# Patient Record
Sex: Male | Born: 1995 | Race: White | Hispanic: No | Marital: Single | Smoking: Current some day smoker
Health system: Southern US, Community
[De-identification: ages and names within clinical notes are randomized; demographics above are authoritative.]

## PROBLEM LIST (undated history)

## (undated) DIAGNOSIS — F909 Attention-deficit hyperactivity disorder, unspecified type: Secondary | ICD-10-CM

---

## 2014-08-16 ENCOUNTER — Encounter (HOSPITAL_COMMUNITY): Payer: Self-pay | Admitting: Emergency Medicine

## 2014-08-16 ENCOUNTER — Emergency Department (HOSPITAL_COMMUNITY)
Admission: EM | Admit: 2014-08-16 | Discharge: 2014-08-16 | Disposition: A | Discharge status: Home / Self Care | Payer: Self-pay | Attending: Emergency Medicine | Admitting: Emergency Medicine

## 2014-08-16 ENCOUNTER — Emergency Department (HOSPITAL_COMMUNITY): Payer: Self-pay

## 2014-08-16 DIAGNOSIS — Y92410 Unspecified street and highway as the place of occurrence of the external cause: Secondary | ICD-10-CM | POA: Insufficient documentation

## 2014-08-16 DIAGNOSIS — S8012XA Contusion of left lower leg, initial encounter: Secondary | ICD-10-CM | POA: Insufficient documentation

## 2014-08-16 DIAGNOSIS — Y9389 Activity, other specified: Secondary | ICD-10-CM | POA: Insufficient documentation

## 2014-08-16 DIAGNOSIS — S0990XA Unspecified injury of head, initial encounter: Secondary | ICD-10-CM | POA: Insufficient documentation

## 2014-08-16 DIAGNOSIS — Y998 Other external cause status: Secondary | ICD-10-CM | POA: Insufficient documentation

## 2014-08-16 MED ORDER — HYDROCODONE-ACETAMINOPHEN 5-325 MG PO TABS: 2.0000 | ORAL_TABLET | ORAL | Status: DC | PRN
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
## 2016-04-02 ENCOUNTER — Emergency Department (HOSPITAL_COMMUNITY)
Admission: EM | Admit: 2016-04-02 | Discharge: 2016-04-02 | Disposition: A | Discharge status: Home / Self Care | Payer: Managed Care, Other (non HMO) | Attending: Emergency Medicine | Admitting: Emergency Medicine

## 2016-04-02 ENCOUNTER — Emergency Department (HOSPITAL_COMMUNITY): Payer: Managed Care, Other (non HMO)

## 2016-04-02 ENCOUNTER — Encounter (HOSPITAL_COMMUNITY): Payer: Self-pay | Admitting: Emergency Medicine

## 2016-04-02 DIAGNOSIS — Y999 Unspecified external cause status: Secondary | ICD-10-CM | POA: Diagnosis not present

## 2016-04-02 DIAGNOSIS — S0093XA Contusion of unspecified part of head, initial encounter: Secondary | ICD-10-CM | POA: Insufficient documentation

## 2016-04-02 DIAGNOSIS — Y9289 Other specified places as the place of occurrence of the external cause: Secondary | ICD-10-CM | POA: Insufficient documentation

## 2016-04-02 DIAGNOSIS — Z23 Encounter for immunization: Secondary | ICD-10-CM | POA: Diagnosis not present

## 2016-04-02 DIAGNOSIS — Y9389 Activity, other specified: Secondary | ICD-10-CM | POA: Insufficient documentation

## 2016-04-02 DIAGNOSIS — S0990XA Unspecified injury of head, initial encounter: Secondary | ICD-10-CM | POA: Diagnosis present

## 2016-04-02 DIAGNOSIS — T148XXA Other injury of unspecified body region, initial encounter: Secondary | ICD-10-CM

## 2016-04-02 MED ORDER — TETANUS-DIPHTH-ACELL PERTUSSIS 5-2.5-18.5 LF-MCG/0.5 IM SUSP
0.5000 mL | Freq: Once | INTRAMUSCULAR | Status: AC
  Administered 2016-04-02: 0.5 mL via INTRAMUSCULAR
  Filled 2016-04-02: qty 0.5

## 2016-04-02 MED ORDER — METHOCARBAMOL 500 MG PO TABS: 500.0000 mg | ORAL_TABLET | Freq: Two times a day (BID) | ORAL | 0 refills | Status: DC

## 2016-04-02 MED ORDER — NAPROXEN 500 MG PO TABS
500.0000 mg | ORAL_TABLET | Freq: Once | ORAL | Status: AC
  Administered 2016-04-02: 500 mg via ORAL
  Filled 2016-04-02: qty 1

## 2016-04-02 MED ORDER — DICLOFENAC SODIUM ER 100 MG PO TB24: 100.0000 mg | ORAL_TABLET | Freq: Every day | ORAL | 0 refills | Status: DC

## 2016-04-02 MED ORDER — ACETAMINOPHEN 500 MG PO TABS
1000.0000 mg | ORAL_TABLET | Freq: Once | ORAL | Status: AC
  Administered 2016-04-02: 1000 mg via ORAL
  Filled 2016-04-02: qty 2
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
## 2016-04-03 ENCOUNTER — Encounter (HOSPITAL_COMMUNITY): Payer: Self-pay | Admitting: Emergency Medicine

## 2018-02-15 ENCOUNTER — Other Ambulatory Visit: Payer: Self-pay

## 2018-02-15 ENCOUNTER — Emergency Department (HOSPITAL_BASED_OUTPATIENT_CLINIC_OR_DEPARTMENT_OTHER)
Admission: EM | Admit: 2018-02-15 | Discharge: 2018-02-15 | Disposition: A | Discharge status: Home / Self Care | Payer: Managed Care, Other (non HMO) | Attending: Emergency Medicine | Admitting: Emergency Medicine

## 2018-02-15 ENCOUNTER — Encounter (HOSPITAL_BASED_OUTPATIENT_CLINIC_OR_DEPARTMENT_OTHER): Payer: Self-pay

## 2018-02-15 DIAGNOSIS — Z87891 Personal history of nicotine dependence: Secondary | ICD-10-CM | POA: Diagnosis not present

## 2018-02-15 DIAGNOSIS — M5441 Lumbago with sciatica, right side: Secondary | ICD-10-CM | POA: Insufficient documentation

## 2018-02-15 DIAGNOSIS — M545 Low back pain: Secondary | ICD-10-CM | POA: Diagnosis present

## 2018-02-15 HISTORY — DX: Attention-deficit hyperactivity disorder, unspecified type: F90.9

## 2018-02-15 MED ORDER — METHOCARBAMOL 500 MG PO TABS: 1000.0000 mg | ORAL_TABLET | Freq: Four times a day (QID) | ORAL | 0 refills | Status: AC

## 2018-02-15 MED ORDER — NAPROXEN 500 MG PO TABS: 500.0000 mg | ORAL_TABLET | Freq: Two times a day (BID) | ORAL | 0 refills | Status: AC

## 2018-02-15 NOTE — Discharge Instructions (Signed)
Please read and follow all provided instructions.  Your diagnoses today include:  1. Acute right-sided low back pain with right-sided sciatica    Tests performed today include:  Vital signs - see below for your results today  Medications prescribed:   Robaxin (methocarbamol) - muscle relaxer medication  DO NOT drive or perform any activities that require you to be awake and alert because this medicine can make you drowsy.    Naproxen - anti-inflammatory pain medication  Do not exceed 500mg  naproxen every 12 hours, take with food  You have been prescribed an anti-inflammatory medication or NSAID. Take with food. Take smallest effective dose for the shortest duration needed for your pain. Stop taking if you experience stomach pain or vomiting.   Take any prescribed medications only as directed.  Home care instructions:   Follow any educational materials contained in this packet  Please rest, use ice or heat on your back for the next several days  Do not lift, push, pull anything more than 10 pounds for the next week  Follow-up instructions: Please follow-up with your primary care provider in the next 1 week for further evaluation of your symptoms.   Return instructions:  SEEK IMMEDIATE MEDICAL ATTENTION IF YOU HAVE:  New numbness, tingling, weakness, or problem with the use of your arms or legs  Severe back pain not relieved with medications  Loss control of your bowels or bladder  Increasing pain in any areas of the body (such as chest or abdominal pain)  Shortness of breath, dizziness, or fainting.   Worsening nausea (feeling sick to your stomach), vomiting, fever, or sweats  Any other emergent concerns regarding your health   Additional Information:  Your vital signs today were: BP 126/78 (BP Location: Right Arm)    Pulse 72    Temp 98.3 F (36.8 C) (Oral)    Resp 17    Ht 5\' 8"  (1.727 m)    Wt 79.4 kg (175 lb)    SpO2 100%    BMI 26.61 kg/m  If your blood  pressure (BP) was elevated above 135/85 this visit, please have this repeated by your doctor within one month. --------------

## 2018-02-15 NOTE — ED Triage Notes (Addendum)
Pt reports worsening 5/10 lower back pain w/o relief from OTC meds and cryotherapy. Pt denies recent injury or fall. Pt A+OX4, NAD, ambulatory independently.

## 2018-02-15 NOTE — ED Provider Notes (Signed)
[facility] EMERGENCY DEPARTMENT Provider Note   CSN: [id number] Arrival date & time: 02/15/18  1714     History   Chief Complaint Chief Complaint  Patient presents with  . Back Pain    HPI Jesus Kemp is a 22 y.o. male.  Patient presents with complaint of back pain starting several days ago without acute injury.  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  Patient has had pain in his right lower back with radiation down into his right leg at times.  He states 2 days ago the pain was so severe he was having difficulty walking.  It is improving but not resolved.  No numbness, tingling, or weakness in his legs.  He has been taking over-the-counter medications and using ice without improvement. Patient denies warning symptoms of back pain including: fecal incontinence, urinary retention or overflow incontinence, night sweats, waking from sleep with back pain, unexplained fevers or weight loss, h/o cancer, IVDU, recent trauma.        Past Medical History:  Diagnosis Date  . ADHD     There are no active problems to display for this patient.   History reviewed. No pertinent surgical history.      Home Medications    Prior to Admission medications   Not on File    Family History History reviewed. No pertinent family history.  Social History Social History   Tobacco Use  . Smoking status: Former Games developermoker  . Smokeless tobacco: Never Used  Substance Use Topics  . Alcohol use: Not on file  . Drug use: Not on file     Allergies   Patient has no known allergies.   Review of Systems Review of Systems  Constitutional: Negative for fever and unexpected weight change.  Gastrointestinal: Negative for constipation.       Neg for fecal incontinence  Genitourinary: Negative for difficulty urinating, flank pain and hematuria.       Negative for urinary incontinence or retention  Musculoskeletal: Positive  for back pain.  Neurological: Negative for weakness and numbness.       Negative for saddle paresthesias      Physical Exam Updated Vital Signs BP 126/78 (BP Location: Right Arm)   Pulse 72   Temp 98.3 F (36.8 C) (Oral)   Resp 17   Ht 5\' 8"  (1.727 m)   Wt 79.4 kg (175 lb)   SpO2 100%   BMI 26.61 kg/m   Physical Exam  Constitutional: He appears well-developed and well-nourished.  HENT:  Head: Normocephalic and atraumatic.  Eyes: Conjunctivae are normal.  Neck: Normal range of motion.  Abdominal: Soft. There is no tenderness. There is no CVA tenderness.  Musculoskeletal:       Cervical back: He exhibits normal range of motion, no tenderness and no bony tenderness.       Thoracic back: He exhibits normal range of motion, no tenderness and no bony tenderness.       Lumbar back: He exhibits decreased range of motion, tenderness and spasm. He exhibits no bony tenderness.       Back:  No step-off noted with palpation of spine.   Neurological: He is alert. He has normal reflexes. No sensory deficit. He exhibits normal muscle tone.  5/5 strength in entire lower extremities bilaterally. No sensation deficit.   Skin: Skin is warm and dry.  Psychiatric: He has a normal mood and affect.  Nursing note and  vitals reviewed.    ED Treatments / Results  Labs (all labs ordered are listed, but only abnormal results are displayed) Labs Reviewed - No data to display  EKG None  Radiology No results found.  Procedures Procedures (including critical care time)  Medications Ordered in ED Medications - No data to display   Initial Impression / Assessment and Plan / ED Course  I have reviewed the triage vital signs and the nursing notes.  Pertinent labs & imaging results that were available during my care of the patient were reviewed by me and considered in my medical decision making (see chart for details).     6:08 PM Patient seen and examined.   Vital signs reviewed and  are as follows: Vitals:   02/15/18 1724  BP: 126/78  Pulse: 72  Resp: 17  Temp: 98.3 F (36.8 C)  SpO2: 100%    No red flag s/s of low back pain. Patient was counseled on back pain precautions and told to do activity as tolerated but do not lift, push, or pull heavy objects more than 10 pounds for the next week.  Patient counseled to use ice or heat on back for no longer than 15 minutes every hour.   Patient counseled on proper use of muscle relaxant medication.  They were told not to drink alcohol, drive any vehicle, or do any dangerous activities while taking this medication.  Patient verbalized understanding.  Patient urged to follow-up with PCP if pain does not improve with treatment and rest or if pain becomes recurrent. Urged to return with worsening severe pain, loss of bowel or bladder control, trouble walking.   The patient verbalizes understanding and agrees with the plan.    Final Clinical Impressions(s) / ED Diagnoses   Final diagnoses:  Acute right-sided low back pain with right-sided sciatica   Patient with back pain, with some radicular features on the right side. No neurological deficits. Patient is ambulatory. No warning symptoms of back pain including: fecal incontinence, urinary retention or overflow incontinence, night sweats, waking from sleep with back pain, unexplained fevers or weight loss, h/o cancer, IVDU, recent trauma. No concern for cauda equina, epidural abscess, or other serious cause of back pain. Conservative measures such as rest, ice/heat and pain medicine indicated with PCP follow-up if no improvement with conservative management.    ED Discharge Orders        Ordered    naproxen (NAPROSYN) 500 MG tablet  2 times daily     02/15/18 1803    methocarbamol (ROBAXIN) 500 MG tablet  4 times daily     02/15/18 1803       Renne Crigler, PA-C 02/15/18 1808    Tegeler, Canary Brim, MD 02/16/18 [phone]

## 2018-05-10 ENCOUNTER — Other Ambulatory Visit: Payer: Self-pay | Admitting: Psychiatry

## 2018-05-29 ENCOUNTER — Ambulatory Visit: Payer: Self-pay | Admitting: Psychiatry

## 2018-06-25 ENCOUNTER — Other Ambulatory Visit: Payer: Self-pay | Admitting: Psychiatry

## 2018-06-25 NOTE — Telephone Encounter (Signed)
Need to review paper chart  

## 2018-07-15 ENCOUNTER — Other Ambulatory Visit: Payer: Self-pay | Admitting: Psychiatry

## 2018-07-16 NOTE — Telephone Encounter (Signed)
Need to review paper chart  

## 2018-07-25 ENCOUNTER — Other Ambulatory Visit: Payer: Self-pay

## 2018-07-25 MED ORDER — SERTRALINE HCL 50 MG PO TABS: 50.0000 mg | ORAL_TABLET | Freq: Every day | ORAL | 0 refills | Status: DC

## 2018-07-25 MED ORDER — LITHIUM CARBONATE ER 300 MG PO TBCR: 600.0000 mg | EXTENDED_RELEASE_TABLET | Freq: Every day | ORAL | 0 refills | Status: DC

## 2018-08-06 ENCOUNTER — Ambulatory Visit: Payer: 59 | Admitting: Psychiatry

## 2018-09-04 ENCOUNTER — Other Ambulatory Visit: Payer: Self-pay

## 2018-09-04 ENCOUNTER — Telehealth: Payer: Self-pay | Admitting: Psychiatry

## 2018-09-04 MED ORDER — SERTRALINE HCL 50 MG PO TABS: 50.0000 mg | ORAL_TABLET | Freq: Every day | ORAL | 0 refills | Status: DC

## 2018-09-04 NOTE — Telephone Encounter (Signed)
Pt needs refill on sertraline. He has made an appt for Monday.

## 2018-09-04 NOTE — Telephone Encounter (Signed)
Refill submitted. 

## 2018-09-08 ENCOUNTER — Ambulatory Visit: Payer: 59 | Admitting: Psychiatry

## 2018-09-08 DIAGNOSIS — F39 Unspecified mood [affective] disorder: Secondary | ICD-10-CM | POA: Diagnosis not present

## 2018-09-08 DIAGNOSIS — F429 Obsessive-compulsive disorder, unspecified: Secondary | ICD-10-CM

## 2018-09-08 MED ORDER — LITHIUM CARBONATE ER 300 MG PO TBCR: EXTENDED_RELEASE_TABLET | ORAL | 1 refills | Status: DC

## 2018-09-08 MED ORDER — SERTRALINE HCL 50 MG PO TABS: 50.0000 mg | ORAL_TABLET | Freq: Every day | ORAL | 1 refills | Status: DC

## 2018-09-08 NOTE — Progress Notes (Signed)
[facility]  Patient ID: Jesus Kemp,  MRN: [id number]  PCP: Patient, No Pcp Per  Date of Evaluation: 09/08/2018 Time spent:25 minutes  Chief Complaint:   HISTORY/CURRENT STATUS: HPI patient seen in June of last year.  At the time he did have some mood swings.  We started him on lithium to help with mood swings.  New He has done well.  Individual Medical History/ Review of Systems: Changes? :no  Allergies: Patient has no known allergies.  Current Medications:  Current Outpatient Medications:  .  lithium carbonate (LITHOBID) 300 MG CR tablet, 1 tab/day, Disp: 90 tablet, Rfl: 1 .  sertraline (ZOLOFT) 50 MG tablet, Take 1 tablet (50 mg total) by mouth daily., Disp: 90 tablet, Rfl: 1 .  methocarbamol (ROBAXIN) 500 MG tablet, Take 2 tablets (1,000 mg total) by mouth 4 (four) times daily., Disp: 20 tablet, Rfl: 0 .  naproxen (NAPROSYN) 500 MG tablet, Take 1 tablet (500 mg total) by mouth 2 (two) times daily., Disp: 20 tablet, Rfl: 0 Medication Side Effects: no  Family Medical/ Social History: Changes? no  MENTAL HEALTH EXAM:  There were no vitals taken for this visit.There is no height or weight on file to calculate BMI.  General Appearance: Casual  Eye Contact:  Good  Speech:  Normal Rate  Volume:  Normal  Mood:  Euthymic  Affect:  Appropriate  Thought Process:  Linear  Orientation:  Full (Time, Place, and Person)  Thought Content: Logical   Suicidal Thoughts:  No  Homicidal Thoughts:  No  Memory:  WNL  Judgement:  Good  Insight:  Good  Psychomotor Activity:  Normal  Concentration:  Concentration: Good  Recall:  Good  Fund of Knowledge: Good  Language: Good  Assets:  Desire for Improvement  ADL's:  Intact  Cognition: WNL  Prognosis:  Good    DIAGNOSES:    ICD-10-CM   1. Episodic mood disorder (HCC) F39 TSH    Lithium level    Basic metabolic panel  2. Obsessive-compulsive disorder, unspecified type F42.9     Receiving Psychotherapy: No     RECOMMENDATIONS: doing well. Continue lithium, nd zoloft./Start NAC if needed. Labs recheck 3 months   Anne Fu, [place]

## 2018-10-02 ENCOUNTER — Ambulatory Visit (INDEPENDENT_AMBULATORY_CARE_PROVIDER_SITE_OTHER): Payer: 59 | Admitting: Psychiatry

## 2018-10-02 DIAGNOSIS — F39 Unspecified mood [affective] disorder: Secondary | ICD-10-CM | POA: Insufficient documentation

## 2018-10-02 MED ORDER — SERTRALINE HCL 50 MG PO TABS: 50.0000 mg | ORAL_TABLET | Freq: Every day | ORAL | 1 refills | Status: AC

## 2018-10-02 MED ORDER — DIVALPROEX SODIUM ER 500 MG PO TB24: ORAL_TABLET | ORAL | 1 refills | Status: DC

## 2018-10-02 NOTE — Progress Notes (Signed)
[facility]  Patient ID: Jesus Kemp,  MRN: [id number]  PCP: Patient, No Pcp Per  Date of Evaluation: 10/02/2018 Time spent:20 minutes  Chief Complaint:   HISTORY/CURRENT STATUS: HPI patient last seen 3 weeks ago he was doing fairly well.  Since that time he started smoking pot every night and he drinks alcohol 7-10 beers plus shots 2 days a week.  His girlfriend is threatening to leave him if he did it if he has not changed he had a lot of outbursts both with and without ocular alcohol anger control no no anger control yells at people's faces worse when he is been drinking several days ago he could not contain his anger but is not breaking things.  Also complains of rapid mood swings for a year manic symptoms last 1 to 3hours then he might go immediately go into depression. He does have several blackouts when he is drinking.  He started lithium about July 2019 and he does feel like it helps. Patient was interested in any kind of treatment that will help whether it be rehab [facility].  Individual Medical History/ Review of Systems: Changes? :No   Allergies: Patient has no known allergies.  Current Medications:  Current Outpatient Medications:  .  lithium carbonate (LITHOBID) 300 MG CR tablet, 1 tab/day, Disp: 90 tablet, Rfl: 1 .  methocarbamol (ROBAXIN) 500 MG tablet, Take 2 tablets (1,000 mg total) by mouth 4 (four) times daily., Disp: 20 tablet, Rfl: 0 .  naproxen (NAPROSYN) 500 MG tablet, Take 1 tablet (500 mg total) by mouth 2 (two) times daily., Disp: 20 tablet, Rfl: 0 .  sertraline (ZOLOFT) 50 MG tablet, Take 1 tablet (50 mg total) by mouth daily., Disp: 90 tablet, Rfl: 1 Medication Side Effects: none  Family Medical/ Social History: Changes? No  MENTAL HEALTH EXAM:  There were no vitals taken for this visit.There is no height or weight on file to calculate BMI.  General Appearance: Casual  Eye Contact:  Good  Speech:  Clear and Coherent   Volume:  Normal  Mood:  Depressed  Affect:  Appropriate  Thought Process:  Linear  Orientation:  Full (Time, Place, and Person)  Thought Content: Logical   Suicidal Thoughts:  No  Homicidal Thoughts:  No  Memory:  WNL  Judgement:  Fair  Insight:  Fair  Psychomotor Activity:  Normal  Concentration:  Concentration: Good  Recall:  Good  Fund of Knowledge: Good  Language: Good  Assets:  Desire for Improvement  ADL's:  Intact  Cognition: WNL  Prognosis:  Fair    DIAGNOSES: No diagnosis found.  Receiving Psychotherapy: No    RECOMMENDATIONS: Patient has increased his alcohol and his pot lately.  He is interested in a rehab center.  Discussed this with Dr. Haywood Lasso and not the patient can surely do that but we recommend [facility]  partial outpatient program and that number is [phone]. We will get lithium level and LFTs for his drinking.  To note patient heavy drug use 2 years ago.   Anne Fu, PA-C

## 2018-10-03 ENCOUNTER — Other Ambulatory Visit: Payer: Self-pay

## 2018-10-03 MED ORDER — DIVALPROEX SODIUM ER 500 MG PO TB24: ORAL_TABLET | ORAL | 1 refills | Status: DC

## 2018-10-03 NOTE — Progress Notes (Signed)
depakote rx not received, resent to [facility].

## 2018-10-07 ENCOUNTER — Telehealth (HOSPITAL_COMMUNITY): Payer: Self-pay | Admitting: Professional

## 2018-10-08 ENCOUNTER — Telehealth (HOSPITAL_COMMUNITY): Payer: Self-pay | Admitting: Professional

## 2018-10-08 NOTE — Telephone Encounter (Signed)
Cln returned pt call for 2x. Pt reports [employer] ref. Him to [facility] "to get help with my BiPolar." Pt states he wants to start group on Friday. Cln explained ax process to pt and that information would be gathered during the phone call to make sure an ax was necessary. Pt states he drinks and smokes marijuana recreationally and gets angry when he does; "I don't get physically violent, but I will throw my Xbox and put a hole in the wall. That kind of thing." Pt states he has a fiance and a baby now and wants to get better for them. Pt reports he is very irritable.  Cln orients pt to group. Pt agrees to group. Cln asked pt if he would be able to take off work and pt reports "I have 2 or sometimes 1 day off each week so I could come on those days." Cln re-states that group is Monday-Friday for an average of 2 weeks and pt would need to be here daily. Pt states he wants to discuss with his fiance "because of my work schedule and stuff" and will call cln back later. Pt hung up.

## 2018-10-09 ENCOUNTER — Ambulatory Visit: Payer: 59 | Admitting: Psychiatry

## 2018-10-11 ENCOUNTER — Emergency Department (HOSPITAL_COMMUNITY): Payer: 59

## 2018-10-11 ENCOUNTER — Other Ambulatory Visit: Payer: Self-pay

## 2018-10-11 ENCOUNTER — Encounter (HOSPITAL_COMMUNITY): Payer: Self-pay | Admitting: Emergency Medicine

## 2018-10-11 ENCOUNTER — Emergency Department (HOSPITAL_COMMUNITY)
Admission: EM | Admit: 2018-10-11 | Discharge: 2018-10-11 | Disposition: A | Discharge status: Home / Self Care | Payer: 59 | Attending: Emergency Medicine | Admitting: Emergency Medicine

## 2018-10-11 DIAGNOSIS — F17200 Nicotine dependence, unspecified, uncomplicated: Secondary | ICD-10-CM | POA: Diagnosis not present

## 2018-10-11 DIAGNOSIS — Z79899 Other long term (current) drug therapy: Secondary | ICD-10-CM | POA: Diagnosis not present

## 2018-10-11 DIAGNOSIS — B349 Viral infection, unspecified: Secondary | ICD-10-CM | POA: Insufficient documentation

## 2018-10-11 DIAGNOSIS — R070 Pain in throat: Secondary | ICD-10-CM | POA: Diagnosis present

## 2018-10-11 LAB — RESPIRATORY PANEL BY PCR
Adenovirus: NOT DETECTED
Bordetella pertussis: NOT DETECTED
CHLAMYDOPHILA PNEUMONIAE-RVPPCR: NOT DETECTED
CORONAVIRUS 229E-RVPPCR: NOT DETECTED
Coronavirus HKU1: NOT DETECTED
Coronavirus NL63: NOT DETECTED
Coronavirus OC43: NOT DETECTED
INFLUENZA A-RVPPCR: NOT DETECTED
INFLUENZA B-RVPPCR: NOT DETECTED
MYCOPLASMA PNEUMONIAE-RVPPCR: NOT DETECTED
Metapneumovirus: NOT DETECTED
PARAINFLUENZA VIRUS 4-RVPPCR: NOT DETECTED
Parainfluenza Virus 1: NOT DETECTED
Parainfluenza Virus 2: NOT DETECTED
Parainfluenza Virus 3: NOT DETECTED
Respiratory Syncytial Virus: NOT DETECTED
Rhinovirus / Enterovirus: NOT DETECTED

## 2018-10-11 LAB — INFLUENZA PANEL BY PCR (TYPE A & B)
Influenza A By PCR: NEGATIVE
Influenza B By PCR: NEGATIVE

## 2018-10-11 NOTE — ED Notes (Signed)
Report from [facility].  Pt refusing to sign Guidance for Persons under Investigation form.  Paged ID.

## 2018-10-11 NOTE — ED Notes (Signed)
Faxed copy of CDC PUI and case report form to [facility].

## 2018-10-11 NOTE — ED Provider Notes (Signed)
[facility] EMERGENCY DEPARTMENT Provider Note   CSN: [id number] Arrival date & time: 10/11/18  1345    History   Chief Complaint Chief Complaint  Patient presents with  . Generalized Body Aches  . Sore Throat    HPI Jesus Kemp is a 23 y.o. male.     HPI Patient reports he pretty rapidly came down with body aches and sore throat was a little bit of cough today.  He reports he is having some chills but did not have a fever that he measured.  He reports he works out at [employer] a lot and he knows what that kind of body ache feels like but this feels different.  He reports he did cough up some sputum this morning that was pale yellow and slightly blood-tinged.  (He did show me an image that showed a small volume of pale mucus with some pink blood streaking).  Patient has no travel history.  He has been in the [place] area.  He does report however his fiance works at [employer] where her coworker's partner tested Covid19 positive.  She reports that coworker was immediately sent home once that information was known. That was 2 days ago. Past Medical History:  Diagnosis Date  . ADHD     Patient Active Problem List   Diagnosis Date Noted  . Episodic mood disorder (HCC) 10/02/2018    History reviewed. No pertinent surgical history.      Home Medications    Prior to Admission medications   Medication Sig Start Date End Date Taking? Authorizing Provider  divalproex (DEPAKOTE ER) 500 MG 24 hr tablet 1 a day for a week, then 2/day. Take in evening with food. Patient taking differently: Take 500-1,000 mg by mouth See admin instructions. Take 1 tablet (500mg ) every evening for one week, then increase to 2 tablets (1000mg ) every evening. Started on 10-04-18 10/03/18  Yes Shugart, Mat Carne, PA-C  lithium carbonate (LITHOBID) 300 MG CR tablet 1 tab/day Patient taking differently: Take 300 mg by mouth at bedtime.  09/08/18  Yes Shugart, Mat Carne, PA-C  sertraline  (ZOLOFT) 50 MG tablet Take 1 tablet (50 mg total) by mouth daily. Patient taking differently: Take 50 mg by mouth at bedtime.  10/02/18  Yes Shugart, Mat Carne, PA-C  methocarbamol (ROBAXIN) 500 MG tablet Take 2 tablets (1,000 mg total) by mouth 4 (four) times daily. Patient not taking: Reported on 10/11/2018 02/15/18   Renne Crigler, PA-C  naproxen (NAPROSYN) 500 MG tablet Take 1 tablet (500 mg total) by mouth 2 (two) times daily. Patient not taking: Reported on 10/11/2018 02/15/18   Renne Crigler, PA-C    Family History No family history on file.  Social History Social History   Tobacco Use  . Smoking status: Current Some Day Smoker  . Smokeless tobacco: Current User  Substance Use Topics  . Alcohol use: Not Currently  . Drug use: Not Currently     Allergies   Patient has no known allergies.   Review of Systems Review of Systems 10 Systems reviewed and are negative for acute change except as noted in the HPI.   Physical Exam Updated Vital Signs BP 115/74 (BP Location: Right Arm)   Pulse 80   Temp (!) 97.5 F (36.4 C)   Resp 17   SpO2 97%   Physical Exam Constitutional:      Appearance: Normal appearance. He is well-developed.     Comments: Patient is clinically well in appearance.  He is alert  and nontoxic.  Well-nourished well-developed.  No respiratory distress.  No active coughing while in the room.  HENT:     Head: Normocephalic and atraumatic.     Nose: Nose normal.     Mouth/Throat:     Mouth: Mucous membranes are moist.     Pharynx: Oropharynx is clear.  Eyes:     Conjunctiva/sclera: Conjunctivae normal.  Neck:     Musculoskeletal: Neck supple.  Cardiovascular:     Rate and Rhythm: Normal rate and regular rhythm.     Heart sounds: Normal heart sounds.  Pulmonary:     Effort: Pulmonary effort is normal.     Breath sounds: Normal breath sounds.  Abdominal:     General: Bowel sounds are normal. There is no distension.     Palpations: Abdomen is soft.      Tenderness: There is no abdominal tenderness.  Musculoskeletal: Normal range of motion.  Skin:    General: Skin is warm and dry.  Neurological:     General: No focal deficit present.     Mental Status: He is alert and oriented to person, place, and time.     GCS: GCS eye subscore is 4. GCS verbal subscore is 5. GCS motor subscore is 6.     Coordination: Coordination normal.  Psychiatric:        Mood and Affect: Mood normal.      ED Treatments / Results  Labs (all labs ordered are listed, but only abnormal results are displayed) Labs Reviewed  RESPIRATORY PANEL BY PCR  NOVEL CORONAVIRUS, NAA (HOSPITAL ORDER, SEND-OUT TO REF LAB)  INFLUENZA PANEL BY PCR (TYPE A & B)    EKG None  Radiology Dg Chest 2 View  Result Date: 10/11/2018 CLINICAL DATA:  Cough EXAM: CHEST - 2 VIEW COMPARISON:  None. FINDINGS: Heart and mediastinal contours are within normal limits. No focal opacities or effusions. No acute bony abnormality. IMPRESSION: No active cardiopulmonary disease. Electronically Signed   By: Charlett Nose M.D.   On: 10/11/2018 17:28    Procedures Procedures (including critical care time)  Medications Ordered in ED Medications - No data to display   Initial Impression / Assessment and Plan / ED Course  I have reviewed the triage vital signs and the nursing notes.  Pertinent labs & imaging results that were available during my care of the patient were reviewed by me and considered in my medical decision making (see chart for details).  Clinical Course as of Oct 11 1850  Sat Oct 11, 2018  1638 Consult: Dr. Orvan Falconer   [MP]    Clinical Course User Index [MP] Arby Barrette, MD       Patient presented with symptoms of acute onset of viral-like illness.  He started with chills and muscle aches.  Patient is clinically well in appearance.  He does not have active fever.  He has tested negative for respiratory panel and influenza.  At this time there is a possible link to  coronavirus.  His fiance works with an individual who has a significant other with confirmed positive.  Patient is being placed on a self quarantine and provided with the CDC requirements sheet.  Return precautions are reviewed.  Final Clinical Impressions(s) / ED Diagnoses   Final diagnoses:  Viral illness Surveillance forCovid19    ED Discharge Orders    None       Arby Barrette, MD 10/11/18 [phone]

## 2018-10-11 NOTE — Discharge Instructions (Addendum)
1.  You are being tested for possible coronavirus due to flulike symptoms and a possible exposure through your family member. 2.  You are to self quarantine as per the instruction sheet provided to you from the emergency department. 3.  Have a phone contact with your family physician to review your symptoms. 4.  If you are getting worsening symptoms and need to return to the emergency department, call ahead to make them aware of your arrival.

## 2018-10-11 NOTE — ED Triage Notes (Signed)
Pt. Stated, I have body aches and a sore throat started today. Sometimes a chill

## 2018-10-11 NOTE — ED Notes (Signed)
Cliffton Asters, ID returned this nurse call.  Advised that pt is refusing to sign Persons Under Investigation form.  Advised at this time to write on the form "refused to sign".   Called Angie @ [phone] to confirm plan.

## 2018-10-20 LAB — NOVEL CORONAVIRUS, NAA (HOSP ORDER, SEND-OUT TO REF LAB; TAT 18-24 HRS): SARS-CoV-2, NAA: NOT DETECTED

## 2018-10-29 ENCOUNTER — Ambulatory Visit (INDEPENDENT_AMBULATORY_CARE_PROVIDER_SITE_OTHER): Payer: 59 | Admitting: Psychiatry

## 2018-10-29 ENCOUNTER — Encounter: Payer: Self-pay | Admitting: Psychiatry

## 2018-10-29 DIAGNOSIS — F429 Obsessive-compulsive disorder, unspecified: Secondary | ICD-10-CM

## 2018-10-29 DIAGNOSIS — F39 Unspecified mood [affective] disorder: Secondary | ICD-10-CM | POA: Diagnosis not present

## 2018-10-29 NOTE — Progress Notes (Signed)
Jesus Kemp [id number] [birth year] 23 y.o.  Subjective:   Patient ID:  Jesus Kemp is a 23 y.o. (DOB 05/31/1970) male.  Chief Complaint:  Chief Complaint  Patient presents with  . Drug Problem  . Alcohol Problem  . Anxiety    HPI Jesus Kemp was called today today for follow-up of his mood disorder, binge drinking, anger problems, and OCD.  At his last visit March 5 he was having anger outbursts and drinking.  He was started on Depakote for anger and mood control.  He complains that he had a lot of side effects with it and could not tolerate it. Depakote SE dizzy, nausea, vomiting, aches, NM.  He has stopped the medication.  His anger problems are better.  He denies any recent episodes of mania or hypomania.  He is not depressed.  He is sleeping well.  Stopped drinking 2 mos.  Easy.  Was binging.  Still obsessions but manageable.  Anxiety overall is manageable  Naturally hyper bc ADHD.  Review of Systems:  Review of Systems  Neurological: Negative for tremors and weakness.  Psychiatric/Behavioral: Positive for sleep disturbance. Negative for agitation, behavioral problems, confusion, decreased concentration, dysphoric mood, hallucinations, self-injury and suicidal ideas. The patient is nervous/anxious. The patient is not hyperactive.     Medications: I have reviewed the patient's current medications.  Current Outpatient Medications  Medication Sig Dispense Refill  . sertraline (ZOLOFT) 50 MG tablet Take 1 tablet (50 mg total) by mouth daily. (Patient taking differently: Take 50 mg by mouth at bedtime. ) 90 tablet 1  . lithium carbonate (LITHOBID) 300 MG CR tablet 1 tab/day (Patient not taking: Reported on 10/29/2018) 90 tablet 1  . methocarbamol (ROBAXIN) 500 MG tablet Take 2 tablets (1,000 mg total) by mouth 4 (four) times daily. (Patient not taking: Reported on 10/11/2018) 20 tablet 0  . naproxen (NAPROSYN) 500 MG tablet Take 1 tablet (500 mg total) by mouth 2  (two) times daily. (Patient not taking: Reported on 10/11/2018) 20 tablet 0   No current facility-administered medications for this visit.     Medication Side Effects: None  Allergies: No Known Allergies  Past Medical History:  Diagnosis Date  . ADHD     History reviewed. No pertinent family history.  Social History   Socioeconomic History  . Marital status: Single    Spouse name: Not on file  . Number of children: Not on file  . Years of education: Not on file  . Highest education level: Not on file  Occupational History  . Not on file  Social Needs  . Financial resource strain: Not on file  . Food insecurity:    Worry: Not on file    Inability: Not on file  . Transportation needs:    Medical: Not on file    Non-medical: Not on file  Tobacco Use  . Smoking status: Current Some Day Smoker  . Smokeless tobacco: Current User  Substance and Sexual Activity  . Alcohol use: Not Currently  . Drug use: Not Currently  . Sexual activity: Not on file  Lifestyle  . Physical activity:    Days per week: Not on file    Minutes per session: Not on file  . Stress: Not on file  Relationships  . Social connections:    Talks on phone: Not on file    Gets together: Not on file    Attends religious service: Not on file    Active member of club or organization: Not on  file    Attends meetings of clubs or organizations: Not on file    Relationship status: Not on file  . Intimate partner violence:    Fear of current or ex partner: Not on file    Emotionally abused: Not on file    Physically abused: Not on file    Forced sexual activity: Not on file  Other Topics Concern  . Not on file  Social History Narrative   ** Merged History Encounter **        Past Medical History, Surgical history, Social history, and Family history were reviewed and updated as appropriate.   Please see review of systems for further details on the patient's review from today.   Objective:    Physical Exam:  There were no vitals taken for this visit.  Physical Exam Neurological:     Mental Status: He is alert and oriented to person, place, and time.     Cranial Nerves: No dysarthria.  Psychiatric:        Attention and Perception: Attention normal.        Mood and Affect: Mood normal.        Speech: Speech normal.        Behavior: Behavior is cooperative.        Thought Content: Thought content normal. Thought content is not paranoid or delusional. Thought content does not include homicidal or suicidal ideation. Thought content does not include homicidal or suicidal plan.        Cognition and Memory: Cognition and memory normal.        Judgment: Judgment normal.     Comments: He has residual obsessions but they are managed well and he satisfied with the current dose of sertraline     Lab Review:  No results found for: NA, K, CL, CO2, GLUCOSE, BUN, CREATININE, CALCIUM, PROT, ALBUMIN, AST, ALT, ALKPHOS, BILITOT, GFRNONAA, GFRAA  No results found for: WBC, RBC, HGB, HCT, PLT, MCV, MCH, MCHC, RDW, LYMPHSABS, MONOABS, EOSABS, BASOSABS  No results found for: POCLITH, LITHIUM   No results found for: PHENYTOIN, PHENOBARB, VALPROATE, CBMZ   .res Assessment: Plan:    Obsessive-compulsive disorder, unspecified type  Episodic mood disorder (HCC) Also recent binge alcohol excess.  That is now stopped.  At his last visit he was having anger problems and Depakote was started.  He could not tolerate it and has stopped it.  He is states his anger problems are improved since he is not drinking.  He has been sober for 2 months.  He says his OCD is managed well with low-dose sertraline.  He does not want medicine changes and I concur.  Follow-up 4 months or sooner if mood problems return or anger.  Maintain sobriety  I connected with patient by a video enabled telemedicine application or telephone, with their informed consent, and verified patient privacy and that I am  speaking with the correct person using two identifiers.  I was located at office and patient at home  Meredith Staggers, MD, DFAPA.     Future Appointments  Date Time Provider Department Center  12/08/2018  9:20 AM Shugart, Mat Carne, PA-C [facility] None    No orders of the defined types were placed in this encounter.     -------------------------------

## 2018-11-03 ENCOUNTER — Telehealth: Payer: Self-pay | Admitting: Psychiatry

## 2018-11-03 MED ORDER — LITHIUM CARBONATE 300 MG PO CAPS: ORAL_CAPSULE | ORAL | 1 refills | Status: DC

## 2018-11-03 NOTE — Telephone Encounter (Signed)
RTC  Lately manic sx.  Not a bad thing.  High on life. Overly.  On the go.  goeal directed.  More talkative and pressured.  More focused and organized.  Most productive ever.  Mother and fiance say eh can't stop talking and is demanding and annoying. Up until 5 AM.  Needs a mood stabilizer.  Doesn't mind taking lithium.  I notice I can't sleep.  Bugging people and doctors.  Weight 180#.  Sober from alcohol 74mos and stopped pot.  He is having a euphoric mania which does not appear to meet criteria for hospitalization.  Will likely need at least 900 lithium to control this and maybe more.  Discussed side effects with him.  He agrees to start it will increase to 900/6 days.  If he has side effects will call us back.  He needs to be put on the cancellation list for an appointment preferably in 2 to 3 weeks if not sooner.  He will call us if his manic symptoms worsen.  Meredith Staggers MD, DFAPA

## 2018-11-03 NOTE — Telephone Encounter (Signed)
Patient of Clay's seen by CC on 4/1. Stated he is having manic episodes.Please advise.

## 2018-11-18 ENCOUNTER — Ambulatory Visit: Payer: 59 | Admitting: Physician Assistant

## 2018-11-18 ENCOUNTER — Other Ambulatory Visit: Payer: Self-pay

## 2018-11-19 ENCOUNTER — Telehealth: Payer: Self-pay | Admitting: Psychiatry

## 2018-11-19 ENCOUNTER — Telehealth: Payer: Self-pay | Admitting: Physician Assistant

## 2018-11-19 NOTE — Telephone Encounter (Signed)
Called Pt to reschedule missed 2-3 week follow up   appt 4/21.  Pt stated, he is feeling much better. Meds are working well. Will call back with any issues. Don't want to reschdule at this time. Pt. Advised has to schedule follow up. How soon should he be schduled?

## 2018-11-19 NOTE — Telephone Encounter (Signed)
Schedule him for follow-up in August with Sherrine Maples

## 2018-11-19 NOTE — Telephone Encounter (Signed)
I contacted pt by phone yesterday for his 1:00 appointment.  He was at work and stated he forgot about the appointment.  He asked to reschedule.  He will call the office to do so.

## 2018-12-08 ENCOUNTER — Ambulatory Visit: Payer: 59 | Admitting: Psychiatry

## 2019-01-09 ENCOUNTER — Other Ambulatory Visit: Payer: Self-pay

## 2019-01-09 MED ORDER — LITHIUM CARBONATE 300 MG PO CAPS: ORAL_CAPSULE | ORAL | 2 refills | Status: AC

## 2019-02-06 ENCOUNTER — Telehealth (HOSPITAL_COMMUNITY): Payer: Self-pay | Admitting: Professional

## 2019-02-06 ENCOUNTER — Telehealth (HOSPITAL_COMMUNITY): Payer: Self-pay | Admitting: Psychiatry

## 2019-02-06 NOTE — Telephone Encounter (Signed)
D:  Pt phoned and left vm that he is interested in [facility].  A:  Placed call to orient pt and provide him with a start date (02-10-19), but there was no answer and vm was full.  Will attempt again on 02-09-19.

## 2019-02-09 ENCOUNTER — Telehealth (HOSPITAL_COMMUNITY): Payer: Self-pay | Admitting: Psychiatry

## 2019-03-03 ENCOUNTER — Ambulatory Visit (INDEPENDENT_AMBULATORY_CARE_PROVIDER_SITE_OTHER): Payer: Managed Care, Other (non HMO) | Admitting: Mental Health

## 2019-03-03 ENCOUNTER — Other Ambulatory Visit: Payer: Self-pay

## 2019-03-03 DIAGNOSIS — F39 Unspecified mood [affective] disorder: Secondary | ICD-10-CM

## 2019-03-03 DIAGNOSIS — F429 Obsessive-compulsive disorder, unspecified: Secondary | ICD-10-CM
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
## 2019-03-10 ENCOUNTER — Ambulatory Visit: Payer: 59 | Admitting: Psychiatry

## 2019-03-20 ENCOUNTER — Ambulatory Visit: Payer: Managed Care, Other (non HMO) | Admitting: Mental Health

## 2019-11-28 ENCOUNTER — Other Ambulatory Visit: Payer: Self-pay

## 2019-11-28 ENCOUNTER — Emergency Department (HOSPITAL_COMMUNITY): Admission: EM | Admit: 2019-11-28 | Discharge: 2019-11-28 | Discharge status: Absent without leave | Payer: 59

## 2019-11-28 NOTE — ED Notes (Signed)
No answer from lobby. Pt seen going across the parking lot

## 2020-05-18 ENCOUNTER — Encounter: Payer: Self-pay | Admitting: Psychiatry

## 2021-05-11 ENCOUNTER — Emergency Department (HOSPITAL_COMMUNITY): Admission: EM | Admit: 2021-05-11 | Discharge: 2021-05-11 | Discharge status: Against Medical Advice | Payer: 59

## 2021-05-11 NOTE — ED Notes (Signed)
Pt decided to leave while being triaged

## 2021-05-12 ENCOUNTER — Emergency Department (HOSPITAL_BASED_OUTPATIENT_CLINIC_OR_DEPARTMENT_OTHER)
Admission: EM | Admit: 2021-05-12 | Discharge: 2021-05-12 | Disposition: A | Discharge status: Home / Self Care | Payer: 59 | Attending: Emergency Medicine | Admitting: Emergency Medicine

## 2021-05-12 ENCOUNTER — Encounter (HOSPITAL_BASED_OUTPATIENT_CLINIC_OR_DEPARTMENT_OTHER): Payer: Self-pay | Admitting: Emergency Medicine

## 2021-05-12 ENCOUNTER — Other Ambulatory Visit: Payer: Self-pay

## 2021-05-12 DIAGNOSIS — F17200 Nicotine dependence, unspecified, uncomplicated: Secondary | ICD-10-CM | POA: Insufficient documentation

## 2021-05-12 DIAGNOSIS — Z20822 Contact with and (suspected) exposure to covid-19: Secondary | ICD-10-CM | POA: Insufficient documentation

## 2021-05-12 DIAGNOSIS — R509 Fever, unspecified: Secondary | ICD-10-CM | POA: Diagnosis present

## 2021-05-12 DIAGNOSIS — B349 Viral infection, unspecified: Secondary | ICD-10-CM | POA: Insufficient documentation

## 2021-05-12 LAB — RESP PANEL BY RT-PCR (FLU A&B, COVID) ARPGX2
Influenza A by PCR: NEGATIVE
Influenza B by PCR: NEGATIVE
SARS Coronavirus 2 by RT PCR: NEGATIVE

## 2021-05-12 LAB — CBC WITH DIFFERENTIAL/PLATELET
Abs Immature Granulocytes: 0.02 10*3/uL (ref 0.00–0.07)
Basophils Absolute: 0 10*3/uL (ref 0.0–0.1)
Basophils Relative: 0 %
Eosinophils Absolute: 0 10*3/uL (ref 0.0–0.5)
Eosinophils Relative: 0 %
HCT: 44.7 % (ref 39.0–52.0)
Hemoglobin: 15.5 g/dL (ref 13.0–17.0)
Immature Granulocytes: 0 %
Lymphocytes Relative: 7 %
Lymphs Abs: 0.6 10*3/uL — ABNORMAL LOW (ref 0.7–4.0)
MCH: 31.3 pg (ref 26.0–34.0)
MCHC: 34.7 g/dL (ref 30.0–36.0)
MCV: 90.3 fL (ref 80.0–100.0)
Monocytes Absolute: 0.8 10*3/uL (ref 0.1–1.0)
Monocytes Relative: 9 %
Neutro Abs: 7.5 10*3/uL (ref 1.7–7.7)
Neutrophils Relative %: 84 %
Platelets: 166 10*3/uL (ref 150–400)
RBC: 4.95 MIL/uL (ref 4.22–5.81)
RDW: 12.2 % (ref 11.5–15.5)
WBC: 9 10*3/uL (ref 4.0–10.5)
nRBC: 0 % (ref 0.0–0.2)

## 2021-05-12 LAB — BASIC METABOLIC PANEL
Anion gap: 9 (ref 5–15)
BUN: 11 mg/dL (ref 6–20)
CO2: 25 mmol/L (ref 22–32)
Calcium: 9.3 mg/dL (ref 8.9–10.3)
Chloride: 99 mmol/L (ref 98–111)
Creatinine, Ser: 0.94 mg/dL (ref 0.61–1.24)
GFR, Estimated: >60 mL/min (ref >60)
Glucose, Bld: 108 mg/dL — ABNORMAL HIGH (ref 70–99)
Potassium: 4 mmol/L (ref 3.5–5.1)
Sodium: 133 mmol/L — ABNORMAL LOW (ref 135–145)

## 2021-05-12 LAB — GROUP A STREP BY PCR: Group A Strep by PCR: NOT DETECTED

## 2021-05-12 MED ORDER — ONDANSETRON HCL 4 MG/2ML IJ SOLN
4.0000 mg | Freq: Once | INTRAMUSCULAR | Status: AC
  Administered 2021-05-12: 4 mg via INTRAVENOUS
  Filled 2021-05-12: qty 2

## 2021-05-12 MED ORDER — ONDANSETRON HCL 4 MG PO TABS: 4.0000 mg | ORAL_TABLET | Freq: Three times a day (TID) | ORAL | 0 refills | Status: AC | PRN

## 2021-05-12 MED ORDER — KETOROLAC TROMETHAMINE 30 MG/ML IJ SOLN
30.0000 mg | Freq: Once | INTRAMUSCULAR | Status: AC
  Administered 2021-05-12: 30 mg via INTRAVENOUS
  Filled 2021-05-12: qty 1

## 2021-05-12 MED ORDER — SODIUM CHLORIDE 0.9 % IV BOLUS
1000.0000 mL | Freq: Once | INTRAVENOUS | Status: AC
  Administered 2021-05-12: 1000 mL via INTRAVENOUS

## 2021-05-12 NOTE — ED Provider Notes (Signed)
[facility] EMERGENCY DEPARTMENT Provider Note   CSN: [id number] Arrival date & time: 05/12/21  0246     History Chief Complaint  Patient presents with   Fever    Jesus Kemp is a 25 y.o. male.  HPI  25 year old male presents emergency department 5 days of generalized illness.  He is complaining of fever, body aches, headache, sore throat.  Been taking over-the-counter medication with only minimal relief.  Fever tonight did not respond to antipyretics and came in for evaluation.  No known sick contacts.  1 episode of nausea/vomiting.  Denies any diarrhea.  No active chest pain or cough.  Past Medical History:  Diagnosis Date   ADHD     Patient Active Problem List   Diagnosis Date Noted   Episodic mood disorder (HCC) 10/02/2018    History reviewed. No pertinent surgical history.     History reviewed. No pertinent family history.  Social History   Tobacco Use   Smoking status: Some Days   Smokeless tobacco: Current  Substance Use Topics   Alcohol use: Not Currently   Drug use: Not Currently    Home Medications Prior to Admission medications   Medication Sig Start Date End Date Taking? Authorizing Provider  lithium carbonate 300 MG capsule take 3 capsules at night 01/09/19   Cottle, Steva Ready., MD  methocarbamol (ROBAXIN) 500 MG tablet Take 2 tablets (1,000 mg total) by mouth 4 (four) times daily. Patient not taking: Reported on 10/11/2018 02/15/18   Renne Crigler, PA-C  naproxen (NAPROSYN) 500 MG tablet Take 1 tablet (500 mg total) by mouth 2 (two) times daily. Patient not taking: Reported on 10/11/2018 02/15/18   Renne Crigler, PA-C  sertraline (ZOLOFT) 50 MG tablet Take 1 tablet (50 mg total) by mouth daily. Patient taking differently: Take 50 mg by mouth at bedtime.  10/02/18   Anne Fu, PA-C    Allergies    Patient has no known allergies.  Review of Systems   Review of Systems  Constitutional:  Positive for chills, fatigue and  fever.  HENT:  Positive for sore throat. Negative for congestion.   Eyes:  Negative for visual disturbance.  Respiratory:  Negative for shortness of breath.   Cardiovascular:  Negative for chest pain.  Gastrointestinal:  Positive for nausea and vomiting. Negative for abdominal pain and diarrhea.  Genitourinary:  Negative for dysuria.  Musculoskeletal:  Positive for myalgias.  Skin:  Negative for rash.  Neurological:  Positive for headaches.   Physical Exam Updated Vital Signs BP 130/75   Pulse (!) 111   Temp 98.2 F (36.8 C) (Oral)   Resp 20   Ht 5\' 8"  (1.727 m)   Wt 72.6 kg   SpO2 99%   BMI 24.33 kg/m   Physical Exam Vitals and nursing note reviewed.  Constitutional:      General: He is not in acute distress.    Appearance: Normal appearance.  HENT:     Head: Normocephalic.     Mouth/Throat:     Mouth: Mucous membranes are moist.  Cardiovascular:     Rate and Rhythm: Normal rate.  Pulmonary:     Effort: Pulmonary effort is normal. No respiratory distress.  Abdominal:     Palpations: Abdomen is soft.     Tenderness: There is no abdominal tenderness.  Musculoskeletal:     Cervical back: No rigidity.  Skin:    General: Skin is warm.  Neurological:     Mental Status: He is alert and  oriented to person, place, and time. Mental status is at baseline.  Psychiatric:        Mood and Affect: Mood normal.    ED Results / Procedures / Treatments   Labs (all labs ordered are listed, but only abnormal results are displayed) Labs Reviewed  RESP PANEL BY RT-PCR (FLU A&B, COVID) ARPGX2  GROUP A STREP BY PCR  CBC WITH DIFFERENTIAL/PLATELET  BASIC METABOLIC PANEL    EKG None  Radiology No results found.  Procedures Procedures   Medications Ordered in ED Medications  sodium chloride 0.9 % bolus 1,000 mL (has no administration in time range)  ketorolac (TORADOL) 30 MG/ML injection 30 mg (has no administration in time range)  ondansetron (ZOFRAN) injection 4 mg  (has no administration in time range)    ED Course  I have reviewed the triage vital signs and the nursing notes.  Pertinent labs & imaging results that were available during my care of the patient were reviewed by me and considered in my medical decision making (see chart for details).    MDM Rules/Calculators/A&P                           25 year old male presents emergency department generalized illness.  Tachycardic on arrival but otherwise stable vitals.  Appears fatigued but nontoxic.  Blood work is reassuring.  Flu and COVID are negative.  Strep is negative.  After medications he feels significantly better.  Most likely suffering from viral illness.  Will be treated symptomatically and as an outpatient.  Patient at this time appears safe and stable for discharge and will be treated as an outpatient.  Discharge plan and strict return to ED precautions discussed, patient verbalizes understanding and agreement.  Final Clinical Impression(s) / ED Diagnoses Final diagnoses:  None    Rx / DC Orders ED Discharge Orders     None        Rozelle Logan, DO 05/12/21 0715

## 2021-05-12 NOTE — Discharge Instructions (Signed)
You have been seen and discharged from the emergency department.  Your flu, COVID and strep swab are negative.  Treat yourself for viral illness.  Take Tylenol/ibuprofen as needed for pain/fever control.  Stay well-hydrated.  Use nausea medicine as needed.  Follow-up with your primary provider for reevaluation and further care. Take home medications as prescribed. If you have any worsening symptoms or further concerns for your health please return to an emergency department for further evaluation.

## 2021-05-12 NOTE — ED Triage Notes (Signed)
Pt with fever, body aches, chills x 2 days.

## 2021-05-12 NOTE — ED Notes (Signed)
Pt c/o fever with body aches for the last 3 days; pt states his daughter was sick but is better; pt has taken 2 covid tests at home and both were negative

## 2022-10-28 ENCOUNTER — Encounter (HOSPITAL_BASED_OUTPATIENT_CLINIC_OR_DEPARTMENT_OTHER): Payer: Self-pay | Admitting: Emergency Medicine

## 2022-10-28 ENCOUNTER — Emergency Department (HOSPITAL_BASED_OUTPATIENT_CLINIC_OR_DEPARTMENT_OTHER)
Admission: EM | Admit: 2022-10-28 | Discharge: 2022-10-28 | Disposition: A | Discharge status: Home / Self Care | Payer: 59 | Attending: Emergency Medicine | Admitting: Emergency Medicine

## 2022-10-28 ENCOUNTER — Other Ambulatory Visit: Payer: Self-pay

## 2022-10-28 DIAGNOSIS — B349 Viral infection, unspecified: Secondary | ICD-10-CM | POA: Diagnosis not present

## 2022-10-28 DIAGNOSIS — Z20822 Contact with and (suspected) exposure to covid-19: Secondary | ICD-10-CM | POA: Insufficient documentation

## 2022-10-28 DIAGNOSIS — R509 Fever, unspecified: Secondary | ICD-10-CM | POA: Diagnosis present

## 2022-10-28 LAB — COMPREHENSIVE METABOLIC PANEL
ALT: 16 U/L (ref 0–44)
AST: 17 U/L (ref 15–41)
Albumin: 4.1 g/dL (ref 3.5–5.0)
Alkaline Phosphatase: 68 U/L (ref 38–126)
Anion gap: 10 (ref 5–15)
BUN: 17 mg/dL (ref 6–20)
CO2: 25 mmol/L (ref 22–32)
Calcium: 9.5 mg/dL (ref 8.9–10.3)
Chloride: 103 mmol/L (ref 98–111)
Creatinine, Ser: 1.04 mg/dL (ref 0.61–1.24)
GFR, Estimated: >60 mL/min (ref >60)
Glucose, Bld: 144 mg/dL — ABNORMAL HIGH (ref 70–99)
Potassium: 3.5 mmol/L (ref 3.5–5.1)
Sodium: 138 mmol/L (ref 135–145)
Total Bilirubin: 0.8 mg/dL (ref 0.3–1.2)
Total Protein: 7.1 g/dL (ref 6.5–8.1)

## 2022-10-28 LAB — CBC WITH DIFFERENTIAL/PLATELET
Abs Immature Granulocytes: 0.03 10*3/uL (ref 0.00–0.07)
Basophils Absolute: 0 10*3/uL (ref 0.0–0.1)
Basophils Relative: 0 %
Eosinophils Absolute: 0.1 10*3/uL (ref 0.0–0.5)
Eosinophils Relative: 1 %
HCT: 43.9 % (ref 39.0–52.0)
Hemoglobin: 15.5 g/dL (ref 13.0–17.0)
Immature Granulocytes: 0 %
Lymphocytes Relative: 5 %
Lymphs Abs: 0.4 10*3/uL — ABNORMAL LOW (ref 0.7–4.0)
MCH: 31.8 pg (ref 26.0–34.0)
MCHC: 35.3 g/dL (ref 30.0–36.0)
MCV: 90 fL (ref 80.0–100.0)
Monocytes Absolute: 0.5 10*3/uL (ref 0.1–1.0)
Monocytes Relative: 6 %
Neutro Abs: 7.7 10*3/uL (ref 1.7–7.7)
Neutrophils Relative %: 88 %
Platelets: 206 10*3/uL (ref 150–400)
RBC: 4.88 MIL/uL (ref 4.22–5.81)
RDW: 12 % (ref 11.5–15.5)
WBC: 8.8 10*3/uL (ref 4.0–10.5)
nRBC: 0 % (ref 0.0–0.2)

## 2022-10-28 LAB — RESP PANEL BY RT-PCR (RSV, FLU A&B, COVID)  RVPGX2
Influenza A by PCR: NEGATIVE
Influenza B by PCR: NEGATIVE
Resp Syncytial Virus by PCR: NEGATIVE
SARS Coronavirus 2 by RT PCR: NEGATIVE

## 2022-10-28 LAB — LIPASE, BLOOD: Lipase: 13 U/L (ref 11–51)

## 2022-10-28 MED ORDER — ONDANSETRON HCL 4 MG/2ML IJ SOLN
4.0000 mg | Freq: Once | INTRAMUSCULAR | Status: AC
  Administered 2022-10-28: 4 mg via INTRAVENOUS
  Filled 2022-10-28: qty 2

## 2022-10-28 MED ORDER — KETOROLAC TROMETHAMINE 30 MG/ML IJ SOLN
30.0000 mg | Freq: Once | INTRAMUSCULAR | Status: AC
  Administered 2022-10-28: 30 mg via INTRAVENOUS
  Filled 2022-10-28: qty 1

## 2022-10-28 MED ORDER — ONDANSETRON 8 MG PO TBDP: ORAL_TABLET | ORAL | 0 refills | Status: AC

## 2022-10-28 MED ORDER — SODIUM CHLORIDE 0.9 % IV BOLUS
1000.0000 mL | Freq: Once | INTRAVENOUS | Status: AC
  Administered 2022-10-28: 1000 mL via INTRAVENOUS

## 2022-10-28 NOTE — ED Notes (Signed)
Discharge instructions provided by edp were discussed with pt. Pt verbalized understanding with no additional questions at this time.  

## 2022-10-28 NOTE — Discharge Instructions (Signed)
Take Tylenol 1000 mg rotated with ibuprofen 600 mg every 4 hours as needed for pain or fever.  Begin taking Zofran as prescribed as needed for nausea.  Clear liquid diet for the next 12 hours, then gradually advance to normal as tolerated.  Return to the emergency department if symptoms significantly worsen or change.

## 2022-10-28 NOTE — ED Provider Notes (Signed)
[facility] Provider Note   CSN: [id number] Arrival date & time: 10/28/22  0145     History  Chief Complaint  Patient presents with   Fever    Jesus Kemp is a 27 y.o. male.  Patient is a 27 year old male accompanied by his mother presenting for evaluation of abdominal pain, body aches, vomiting, and diarrhea.  This started acutely earlier this evening.  He describes multiple episodes of vomiting and diarrhea that have been nonbloody.  He reports that his daughter was recently sick with similar symptoms.  Patient had fever at home and arrives here with temp of 101.9 rectal.  The history is provided by the patient.       Home Medications Prior to Admission medications   Medication Sig Start Date End Date Taking? Authorizing Provider  lithium carbonate 300 MG capsule take 3 capsules at night 01/09/19   Cottle, Billey Co., MD  methocarbamol (ROBAXIN) 500 MG tablet Take 2 tablets (1,000 mg total) by mouth 4 (four) times daily. Patient not taking: Reported on 10/11/2018 02/15/18   Carlisle Cater, PA-C  naproxen (NAPROSYN) 500 MG tablet Take 1 tablet (500 mg total) by mouth 2 (two) times daily. Patient not taking: Reported on 10/11/2018 02/15/18   Carlisle Cater, PA-C  ondansetron (ZOFRAN) 4 MG tablet Take 1 tablet (4 mg total) by mouth every 8 (eight) hours as needed for nausea or vomiting. 05/12/21   Horton, Alvin Critchley, DO  sertraline (ZOLOFT) 50 MG tablet Take 1 tablet (50 mg total) by mouth daily. Patient taking differently: Take 50 mg by mouth at bedtime.  10/02/18   Comer Locket, PA-C      Allergies    Patient has no known allergies.    Review of Systems   Review of Systems  All other systems reviewed and are negative.   Physical Exam Updated Vital Signs BP (!) 150/85   Pulse (!) 103   Temp (!) 101.9 F (38.8 C) (Rectal)   Resp 18   Ht 5\' 8"  (1.727 m)   Wt 72.6 kg   SpO2 100%   BMI 24.33 kg/m  Physical Exam Vitals  and nursing note reviewed.  Constitutional:      General: He is not in acute distress.    Appearance: He is well-developed. He is not diaphoretic.  HENT:     Head: Normocephalic and atraumatic.  Cardiovascular:     Rate and Rhythm: Normal rate and regular rhythm.     Heart sounds: No murmur heard.    No friction rub.  Pulmonary:     Effort: Pulmonary effort is normal. No respiratory distress.     Breath sounds: Normal breath sounds. No wheezing or rales.  Abdominal:     General: Bowel sounds are normal. There is no distension.     Palpations: Abdomen is soft.     Tenderness: There is abdominal tenderness. There is no right CVA tenderness, left CVA tenderness, guarding or rebound.     Comments: There is generalized abdominal tenderness, but no peritoneal signs.  Musculoskeletal:        General: Normal range of motion.     Cervical back: Normal range of motion and neck supple.  Skin:    General: Skin is warm and dry.  Neurological:     Mental Status: He is alert and oriented to person, place, and time.     Coordination: Coordination normal.     ED Results / Procedures / Treatments  Labs (all labs ordered are listed, but only abnormal results are displayed) Labs Reviewed  RESP PANEL BY RT-PCR (RSV, FLU A&B, COVID)  RVPGX2  COMPREHENSIVE METABOLIC PANEL  CBC WITH DIFFERENTIAL/PLATELET  LIPASE, BLOOD    EKG None  Radiology No results found.  Procedures Procedures    Medications Ordered in ED Medications  ondansetron (ZOFRAN) injection 4 mg (has no administration in time range)  ketorolac (TORADOL) 30 MG/ML injection 30 mg (has no administration in time range)  sodium chloride 0.9 % bolus 1,000 mL (1,000 mLs Intravenous New Bag/Given 10/28/22 0226)    ED Course/ Medical Decision Making/ A&P  Patient is a 27 year old male presenting with abdominal pain, body aches, vomiting, diarrhea.  Symptoms came on suddenly this evening.  He arrives here febrile with a temp of  101.9.  Physical examination reveals no acute findings, however patient does appear uncomfortable.  Workup initiated including CBC, CMP, lipase, and respiratory panel.  All studies have returned and are unremarkable.  Patient has been hydrated with normal saline and was given Toradol and Zofran for pain and nausea.  He is now feeling significantly improved.  I highly suspect patient's symptoms are related to a viral process.  He has been reexamined and his abdomen remains benign.  Patient ambulatory in the department and I feel can safely be discharged.  He will be given Zofran and advised to take alternating Tylenol and ibuprofen.  To return as needed.  Final Clinical Impression(s) / ED Diagnoses Final diagnoses:  None    Rx / DC Orders ED Discharge Orders     None         Veryl Speak, MD 10/28/22 [phone]
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
## 2024-03-09 ENCOUNTER — Emergency Department (HOSPITAL_BASED_OUTPATIENT_CLINIC_OR_DEPARTMENT_OTHER)
Admission: EM | Admit: 2024-03-09 | Discharge: 2024-03-09 | Disposition: A | Discharge status: Home / Self Care | Attending: Emergency Medicine | Admitting: Emergency Medicine

## 2024-03-09 ENCOUNTER — Emergency Department (HOSPITAL_BASED_OUTPATIENT_CLINIC_OR_DEPARTMENT_OTHER)

## 2024-03-09 ENCOUNTER — Other Ambulatory Visit: Payer: Self-pay

## 2024-03-09 ENCOUNTER — Encounter (HOSPITAL_BASED_OUTPATIENT_CLINIC_OR_DEPARTMENT_OTHER): Payer: Self-pay

## 2024-03-09 DIAGNOSIS — Y93I9 Activity, other involving external motion: Secondary | ICD-10-CM | POA: Diagnosis not present

## 2024-03-09 DIAGNOSIS — R109 Unspecified abdominal pain: Secondary | ICD-10-CM | POA: Diagnosis present

## 2024-03-09 DIAGNOSIS — M542 Cervicalgia: Secondary | ICD-10-CM | POA: Insufficient documentation

## 2024-03-09 DIAGNOSIS — J9859 Other diseases of mediastinum, not elsewhere classified: Secondary | ICD-10-CM | POA: Insufficient documentation

## 2024-03-09 DIAGNOSIS — R1030 Lower abdominal pain, unspecified: Secondary | ICD-10-CM | POA: Insufficient documentation

## 2024-03-09 DIAGNOSIS — M545 Low back pain, unspecified: Secondary | ICD-10-CM | POA: Insufficient documentation

## 2024-03-09 DIAGNOSIS — M546 Pain in thoracic spine: Secondary | ICD-10-CM | POA: Diagnosis not present

## 2024-03-09 DIAGNOSIS — Y92410 Unspecified street and highway as the place of occurrence of the external cause: Secondary | ICD-10-CM | POA: Insufficient documentation

## 2024-03-09 LAB — COMPREHENSIVE METABOLIC PANEL WITH GFR
ALT: 23 U/L (ref 0–44)
AST: 26 U/L (ref 15–41)
Albumin: 4.2 g/dL (ref 3.5–5.0)
Alkaline Phosphatase: 84 U/L (ref 38–126)
Anion gap: 12 (ref 5–15)
BUN: 17 mg/dL (ref 6–20)
CO2: 21 mmol/L — ABNORMAL LOW (ref 22–32)
Calcium: 9.3 mg/dL (ref 8.9–10.3)
Chloride: 106 mmol/L (ref 98–111)
Creatinine, Ser: 0.91 mg/dL (ref 0.61–1.24)
GFR, Estimated: >60 mL/min (ref >60)
Glucose, Bld: 98 mg/dL (ref 70–99)
Potassium: 4.1 mmol/L (ref 3.5–5.1)
Sodium: 138 mmol/L (ref 135–145)
Total Bilirubin: 0.4 mg/dL (ref 0.0–1.2)
Total Protein: 6.5 g/dL (ref 6.5–8.1)

## 2024-03-09 LAB — CBC WITH DIFFERENTIAL/PLATELET
Abs Immature Granulocytes: 0.02 K/uL (ref 0.00–0.07)
Basophils Absolute: 0 K/uL (ref 0.0–0.1)
Basophils Relative: 1 %
Eosinophils Absolute: 0.4 K/uL (ref 0.0–0.5)
Eosinophils Relative: 6 %
HCT: 41.9 % (ref 39.0–52.0)
Hemoglobin: 14.3 g/dL (ref 13.0–17.0)
Immature Granulocytes: 0 %
Lymphocytes Relative: 23 %
Lymphs Abs: 1.6 K/uL (ref 0.7–4.0)
MCH: 31.2 pg (ref 26.0–34.0)
MCHC: 34.1 g/dL (ref 30.0–36.0)
MCV: 91.5 fL (ref 80.0–100.0)
Monocytes Absolute: 0.6 K/uL (ref 0.1–1.0)
Monocytes Relative: 9 %
Neutro Abs: 4.2 K/uL (ref 1.7–7.7)
Neutrophils Relative %: 61 %
Platelets: 186 K/uL (ref 150–400)
RBC: 4.58 MIL/uL (ref 4.22–5.81)
RDW: 12.4 % (ref 11.5–15.5)
WBC: 6.8 K/uL (ref 4.0–10.5)
nRBC: 0 % (ref 0.0–0.2)

## 2024-03-09 MED ORDER — IOHEXOL 300 MG/ML  SOLN
100.0000 mL | Freq: Once | INTRAMUSCULAR | Status: AC | PRN
  Administered 2024-03-09 (×2): 100 mL via INTRAVENOUS
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
## 2024-03-09 NOTE — ED Notes (Signed)
 This RN entered room to patient lying in bed with C-collar next to him in bed. This RN asked patient if he had removed it himself. Patient states Yeah I took it off. This RN educated patient on importance of wearing C-collar until scans have been read by the radiologist. Patient verbalized understanding.

## 2024-03-09 NOTE — ED Provider Notes (Signed)
 Patient signed out to me by Dr. Carita or pending results of his CAT scans.  CT scan's were significant for possible minor contained venous bleeding in the mediastinum.  There is no evidence of pneumothorax or mediastinal widening.  Patient has no chest pain at this time.  Given return precautions   Jesus Faden, MD 03/09/24 [phone]

## 2024-03-09 NOTE — Discharge Instructions (Signed)
 The radiologist thinks that there may be a small amount of bleeding in your chest from a vein.  This will likely resolve on its own without treatment.  You do not have any signs of collapsed lung, vascular injury, or any surgical process.  Return here if you develop severe pain in your chest, shortness of breath, or any other trouble.
